# Patient Record
Sex: Male | Born: 1990 | Race: White | Hispanic: No | Marital: Single
Health system: Southern US, Community
[De-identification: ages and names within clinical notes are randomized; demographics above are authoritative.]

## PROBLEM LIST (undated history)

## (undated) DIAGNOSIS — J45909 Unspecified asthma, uncomplicated: Secondary | ICD-10-CM

## (undated) HISTORY — PX: HERNIA REPAIR: SHX51

---
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
## 2019-09-25 ENCOUNTER — Emergency Department (HOSPITAL_COMMUNITY): Payer: No Typology Code available for payment source

## 2019-09-25 ENCOUNTER — Encounter (HOSPITAL_COMMUNITY): Payer: Self-pay | Admitting: Emergency Medicine

## 2019-09-25 ENCOUNTER — Emergency Department (HOSPITAL_COMMUNITY)
Admission: EM | Admit: 2019-09-25 | Discharge: 2019-09-25 | Disposition: A | Discharge status: Home / Self Care | Payer: No Typology Code available for payment source | Attending: Emergency Medicine | Admitting: Emergency Medicine

## 2019-09-25 DIAGNOSIS — R1032 Left lower quadrant pain: Secondary | ICD-10-CM | POA: Insufficient documentation

## 2019-09-25 DIAGNOSIS — R109 Unspecified abdominal pain: Secondary | ICD-10-CM

## 2019-09-25 HISTORY — DX: Unspecified asthma, uncomplicated: J45.909

## 2019-09-25 LAB — URINALYSIS, ROUTINE W REFLEX MICROSCOPIC
Bilirubin Urine: NEGATIVE
Glucose, UA: NEGATIVE mg/dL
Hgb urine dipstick: NEGATIVE
Ketones, ur: NEGATIVE mg/dL
Leukocytes,Ua: NEGATIVE
Nitrite: NEGATIVE
Protein, ur: NEGATIVE mg/dL
Specific Gravity, Urine: 1.026 (ref 1.005–1.030)
pH: 6 (ref 5.0–8.0)

## 2019-09-25 LAB — CBC
HCT: 48.8 % (ref 39.0–52.0)
Hemoglobin: 16.3 g/dL (ref 13.0–17.0)
MCH: 30.5 pg (ref 26.0–34.0)
MCHC: 33.4 g/dL (ref 30.0–36.0)
MCV: 91.4 fL (ref 80.0–100.0)
Platelets: 350 10*3/uL (ref 150–400)
RBC: 5.34 MIL/uL (ref 4.22–5.81)
RDW: 12.2 % (ref 11.5–15.5)
WBC: 7.5 10*3/uL (ref 4.0–10.5)
nRBC: 0 % (ref 0.0–0.2)

## 2019-09-25 LAB — COMPREHENSIVE METABOLIC PANEL
ALT: 15 U/L (ref 0–44)
AST: 15 U/L (ref 15–41)
Albumin: 4.4 g/dL (ref 3.5–5.0)
Alkaline Phosphatase: 49 U/L (ref 38–126)
Anion gap: 8 (ref 5–15)
BUN: 16 mg/dL (ref 6–20)
CO2: 27 mmol/L (ref 22–32)
Calcium: 9.4 mg/dL (ref 8.9–10.3)
Chloride: 106 mmol/L (ref 98–111)
Creatinine, Ser: 1 mg/dL (ref 0.61–1.24)
GFR calc Af Amer: >60 mL/min (ref >60)
GFR calc non Af Amer: >60 mL/min (ref >60)
Glucose, Bld: 113 mg/dL — ABNORMAL HIGH (ref 70–99)
Potassium: 3.9 mmol/L (ref 3.5–5.1)
Sodium: 141 mmol/L (ref 135–145)
Total Bilirubin: 0.7 mg/dL (ref 0.3–1.2)
Total Protein: 6.9 g/dL (ref 6.5–8.1)

## 2019-09-25 LAB — LIPASE, BLOOD: Lipase: 25 U/L (ref 11–51)

## 2019-09-25 MED ORDER — SODIUM CHLORIDE 0.9% FLUSH: 3.0000 mL | Freq: Once | INTRAVENOUS | Status: DC

## 2019-09-25 NOTE — ED Triage Notes (Signed)
Pt states after heavy lifting at work he began to have pain in his left abdomen down into left groin. Pt states he has had a hernia repair a few years ago and feel the same.

## 2019-09-25 NOTE — ED Notes (Signed)
Patient verbalizes understanding of discharge instructions. Opportunity for questioning and answers were provided. Armband removed by staff, pt discharged from ED.  

## 2019-09-25 NOTE — Discharge Instructions (Addendum)
Possible muscle strain. Recommend Motrin every 8 hours for the next few days. Take with food. Follow up with your worker's comp provider for recheck if not improving in the next 2 days. Return to ER for worsening or concerning symptoms. Given lifting restrictions for the next 2 days, if unable to resume full duty after 2 days, see worker's comp provider.

## 2019-09-25 NOTE — ED Provider Notes (Signed)
[facility] EMERGENCY DEPARTMENT Provider Note   CSN: [id number] Arrival date & time: 09/25/19  1552     History Chief Complaint  Patient presents with  . Abdominal Pain    Aaron Juarez is a 29 y.o. male.  29 year old male presents with complaint of left side pain.  Patient states that he was at work doing a lot of heavy lifting and noticed pain in his left flank/anterior abdomen radiating towards his left scrotum.  Patient states that he is not sure if he pulled a muscle during the heavy lifting at work, also concerned due to pain similar to when he had a left inguinal hernia which was repaired in 2018 in [place], [place].  Denies changes in bowel or bladder habits, hematuria, nausea, vomiting, fevers or chills.  No other complaints or concerns.        Past Medical History:  Diagnosis Date  . Asthma     There are no problems to display for this patient.   Past Surgical History:  Procedure Laterality Date  . HERNIA REPAIR         No family history on file.  Social History   Tobacco Use  . Smoking status: Not on file  Substance Use Topics  . Alcohol use: Not on file  . Drug use: Not on file    Home Medications Prior to Admission medications   Not on File    Allergies    Patient has no known allergies.  Review of Systems   Review of Systems  Constitutional: Negative for fever.  Gastrointestinal: Positive for abdominal pain. Negative for constipation, diarrhea, nausea and vomiting.  Genitourinary: Negative for dysuria, frequency, scrotal swelling and testicular pain.  Musculoskeletal: Negative for arthralgias, back pain and myalgias.  Skin: Negative for rash and wound.  All other systems reviewed and are negative.   Physical Exam Updated Vital Signs BP 125/85 (BP Location: Left Arm)   Pulse 85   Temp 98.2 F (36.8 C) (Oral)   Resp 16   Ht 5\' 10"  (1.778 m)   Wt 77.1 kg   SpO2 98%   BMI 24.39 kg/m   Physical Exam Vitals  and nursing note reviewed. Exam conducted with a chaperone present.  Constitutional:      General: He is not in acute distress.    Appearance: He is well-developed. He is not diaphoretic.  HENT:     Head: Normocephalic and atraumatic.  Pulmonary:     Effort: Pulmonary effort is normal.  Abdominal:     Palpations: Abdomen is soft.     Tenderness: There is no abdominal tenderness. There is no right CVA tenderness or left CVA tenderness.    Genitourinary:    Testes:        Right: Mass, tenderness or swelling not present.        Left: Mass, tenderness or swelling not present.  Skin:    General: Skin is warm and dry.     Findings: No rash.  Neurological:     Mental Status: He is alert and oriented to person, place, and time.  Psychiatric:        Behavior: Behavior normal.     ED Results / Procedures / Treatments   Labs (all labs ordered are listed, but only abnormal results are displayed) Labs Reviewed  COMPREHENSIVE METABOLIC PANEL - Abnormal; Notable for the following components:      Result Value   Glucose, Bld 113 (*)    All other components within  normal limits  URINALYSIS, ROUTINE W REFLEX MICROSCOPIC - Abnormal; Notable for the following components:   APPearance HAZY (*)    All other components within normal limits  LIPASE, BLOOD  CBC    EKG None  Radiology DG Abd Acute W/Chest  Result Date: 09/25/2019 CLINICAL DATA:  Left-sided abdominal pain for 1 day, history of asthma EXAM: DG ABDOMEN ACUTE W/ 1V CHEST COMPARISON:  None. FINDINGS: No consolidation, features of edema, pneumothorax, or effusion. Pulmonary vascularity is normally distributed. The cardiomediastinal contours are unremarkable. No high-grade obstructive bowel gas pattern. No suspicious calcifications overlying the gallbladder fossa, renal shadows or urinary bladder. Few calcified phleboliths noted in the pelvis. No convincing evidence of free intraperitoneal air or portal venous gas. Mild levocurvature  of the lumbar spine. Osseous structures are otherwise unremarkable. No acute abnormality of the soft tissues. IMPRESSION: 1. No acute cardiopulmonary disease. 2. No high-grade obstructive bowel gas pattern. Electronically Signed   By: Lovena Le M.D.   On: 09/25/2019 18:40    Procedures Procedures (including critical care time)  Medications Ordered in ED Medications  sodium chloride flush (NS) 0.9 % injection 3 mL (has no administration in time range)    ED Course  I have reviewed the triage vital signs and the nursing notes.  Pertinent labs & imaging results that were available during my care of the patient were reviewed by me and considered in my medical decision making (see chart for details).  Clinical Course as of Sep 24 1845  Tue Sep 25, 8050  [id number] 29 year old male with left-sided abdominal pain after heavy lifting at work yesterday.  Pain radiates towards scrotum, no testicular pain, no changes in bowel or bladder habits, no nausea or vomiting.  On exam, patient is well-appearing, no appreciable hernia, abdomen is soft and nontender.  Labs are reassuring including normal CBC, CMP, UA, lipase.  Abdominal x-ray without acute or significant findings. Consider possible muscle strain, recommend lifting restrictions, work note given for same.  Patient to see Worker's Comp. Probation officer for recheck if not improving in 2 days, advised to take ibuprofen every 8 hours for the next few days.  Turn to ER for worsening or concerning symptoms otherwise follow-up with Worker's Comp. Provider.    [LM]    Clinical Course User Index [LM] Roque Lias   MDM Rules/Calculators/A&P                      Final Clinical Impression(s) / ED Diagnoses Final diagnoses:  Abdominal pain, unspecified abdominal location    Rx / DC Orders ED Discharge Orders    None       Tacy Learn, PA-C 09/25/19 1847    Lajean Saver, MD 09/27/19 1353

## 2021-03-25 IMAGING — DX DG ABDOMEN ACUTE W/ 1V CHEST
3 series · 3 of 3 positions shown · non-contrast
Comparison: None.

CLINICAL DATA: Left-sided abdominal pain for 1 day, history of
asthma

EXAM:
DG ABDOMEN ACUTE W/ 1V CHEST

[chest pa]
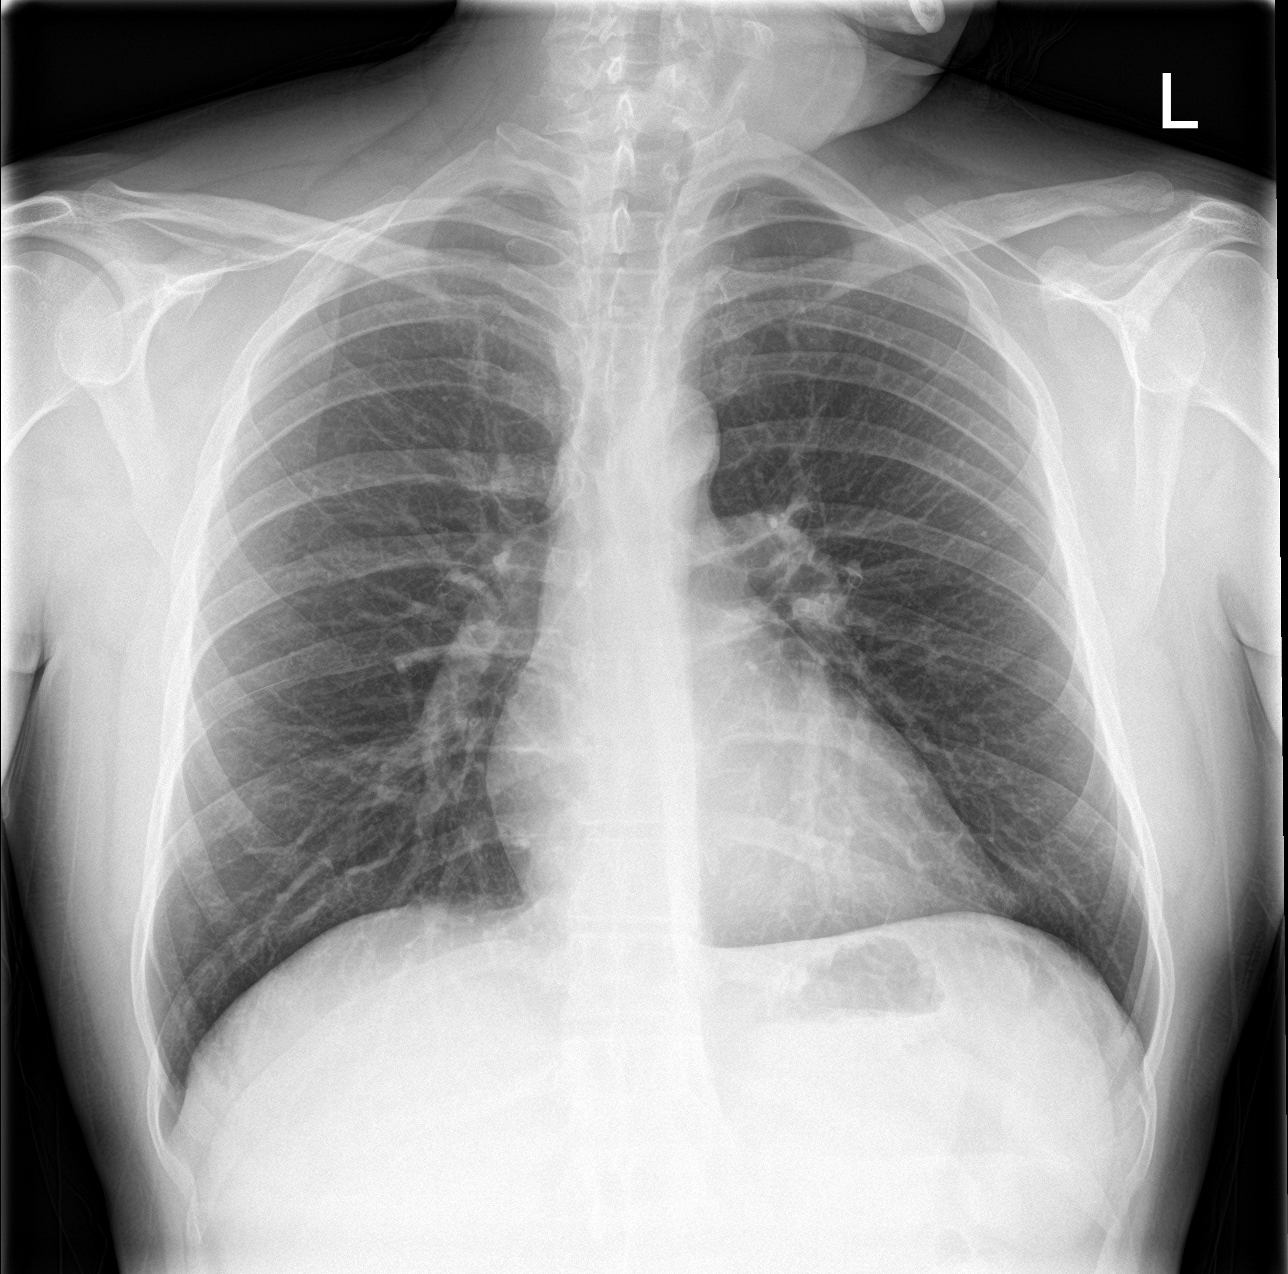

[abdomen erect]
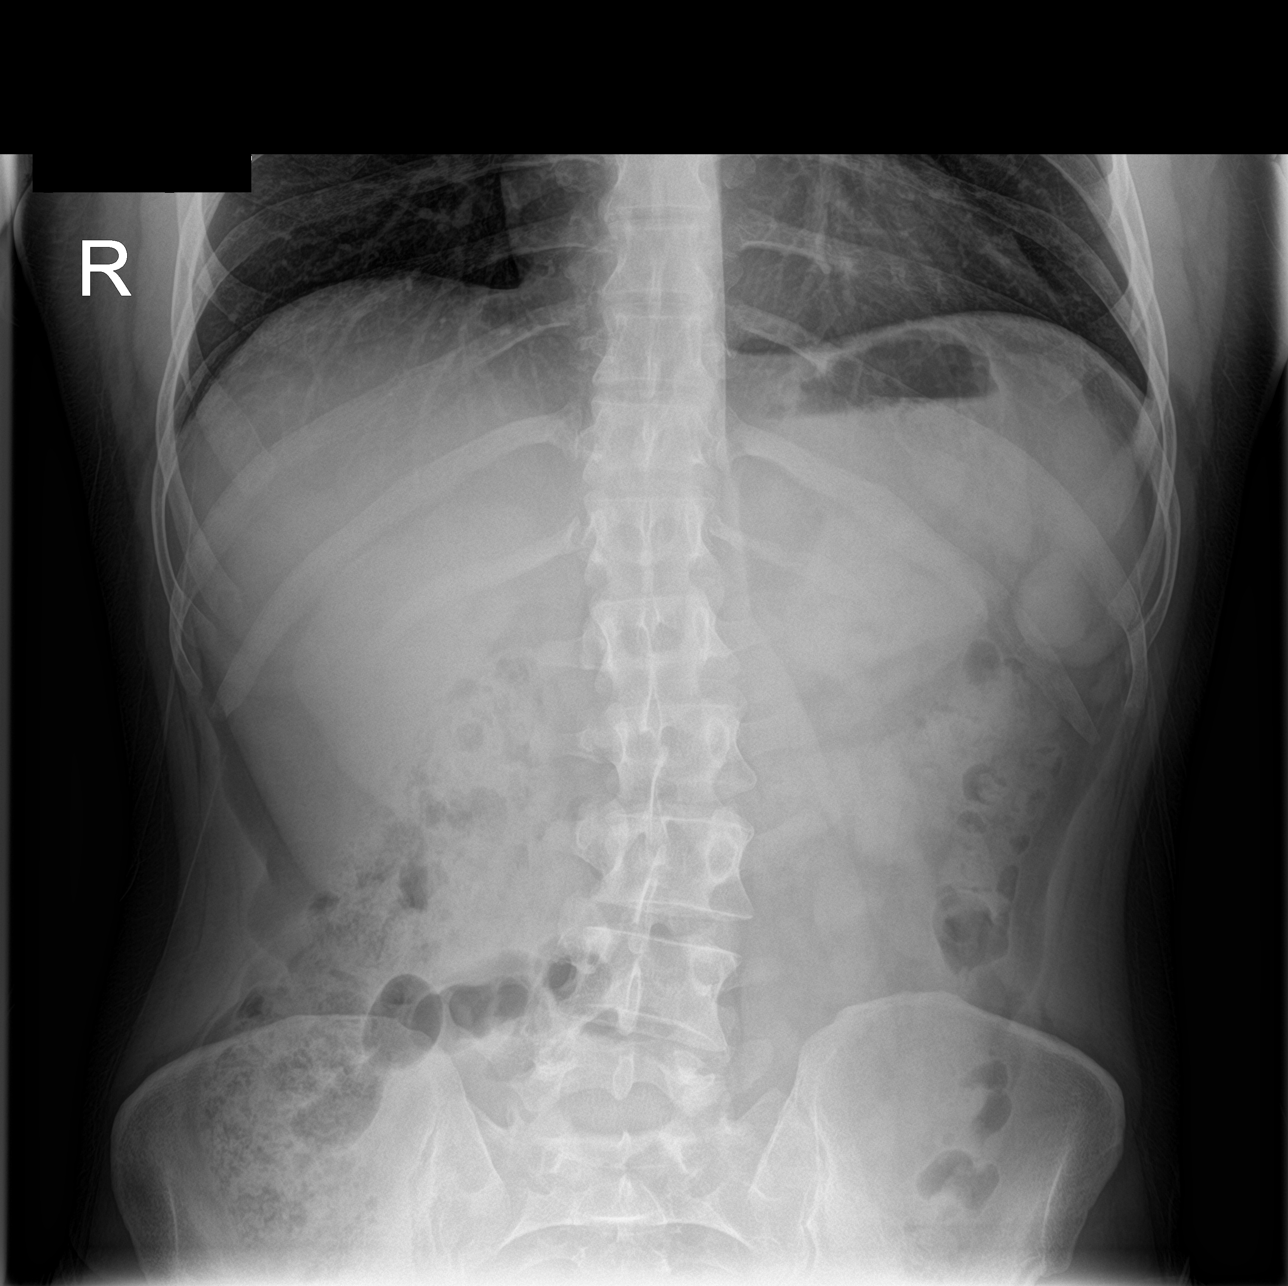

[abdomen supine]
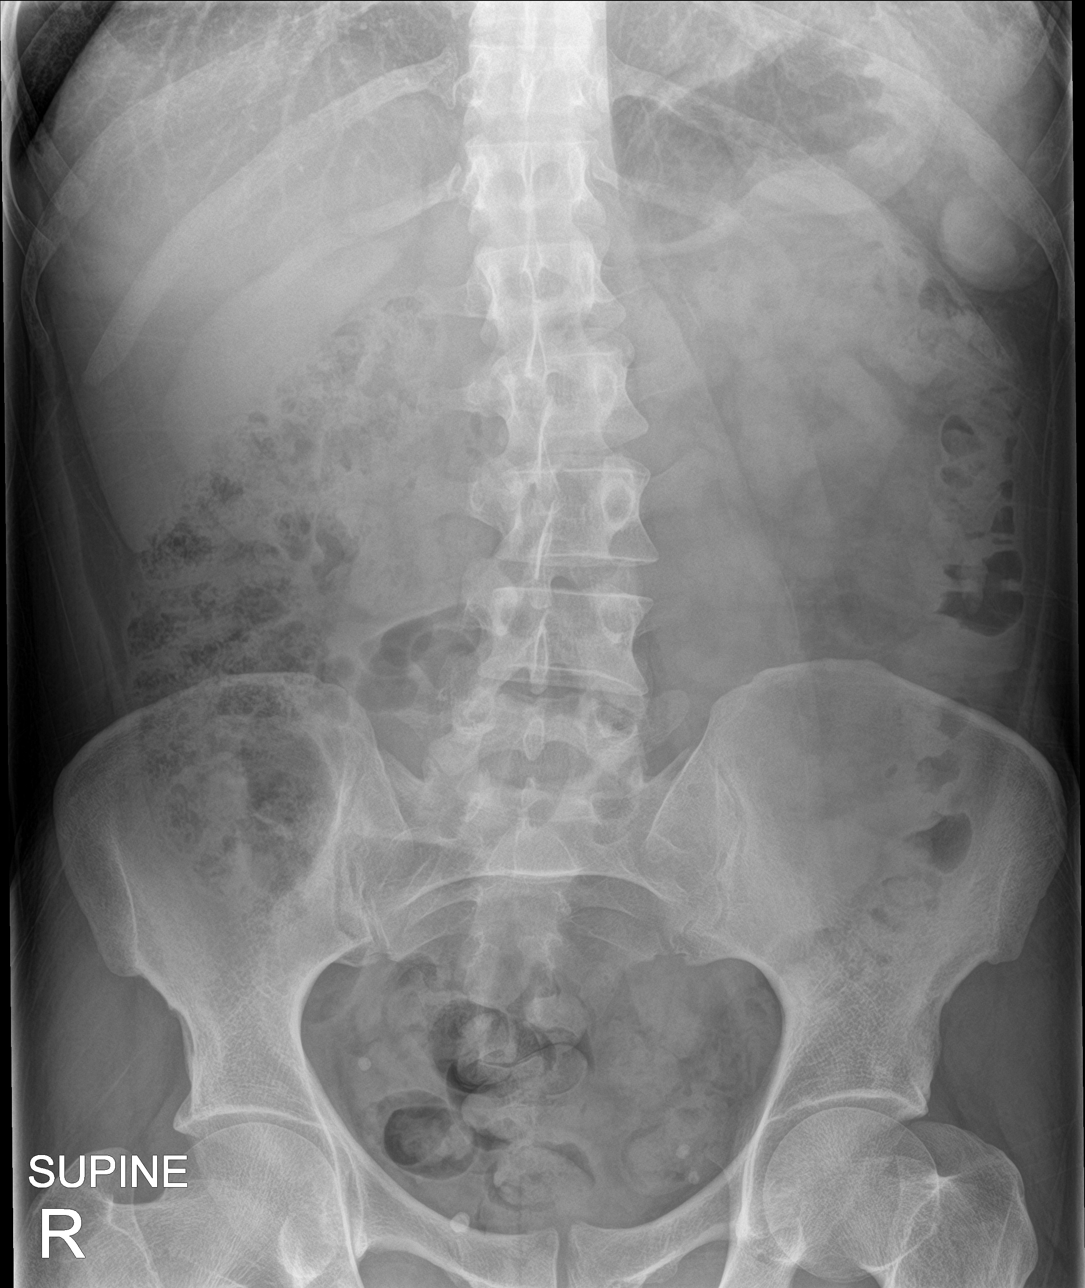

[3 of 3 positions shown; findings below may reference images not displayed]

FINDINGS: No consolidation, features of edema, pneumothorax, or effusion.
Pulmonary vascularity is normally distributed. The cardiomediastinal
contours are unremarkable.

No high-grade obstructive bowel gas pattern. No suspicious
calcifications overlying the gallbladder fossa, renal shadows or
urinary bladder. Few calcified phleboliths noted in the pelvis. No
convincing evidence of free intraperitoneal air or portal venous
gas.

Mild levocurvature of the lumbar spine. Osseous structures are
otherwise unremarkable. No acute abnormality of the soft tissues.
IMPRESSION: 1. No acute cardiopulmonary disease.
2. No high-grade obstructive bowel gas pattern.
